# Patient Record
Sex: Female | Born: 1935
Health system: Southern US, Community
[De-identification: ages and names within clinical notes are randomized; demographics above are authoritative.]

## PROBLEM LIST (undated history)

## (undated) DIAGNOSIS — I10 Essential (primary) hypertension: Secondary | ICD-10-CM

---

## 2011-08-22 DIAGNOSIS — Z6828 Body mass index (BMI) 28.0-28.9, adult: Secondary | ICD-10-CM | POA: Diagnosis not present

## 2011-08-22 DIAGNOSIS — E782 Mixed hyperlipidemia: Secondary | ICD-10-CM | POA: Diagnosis not present

## 2011-08-22 DIAGNOSIS — H902 Conductive hearing loss, unspecified: Secondary | ICD-10-CM | POA: Diagnosis not present

## 2011-08-22 DIAGNOSIS — Z1389 Encounter for screening for other disorder: Secondary | ICD-10-CM | POA: Diagnosis not present

## 2011-08-22 DIAGNOSIS — I10 Essential (primary) hypertension: Secondary | ICD-10-CM | POA: Diagnosis not present

## 2011-12-18 DIAGNOSIS — E782 Mixed hyperlipidemia: Secondary | ICD-10-CM | POA: Diagnosis not present

## 2011-12-18 DIAGNOSIS — H6120 Impacted cerumen, unspecified ear: Secondary | ICD-10-CM | POA: Diagnosis not present

## 2011-12-18 DIAGNOSIS — I10 Essential (primary) hypertension: Secondary | ICD-10-CM | POA: Diagnosis not present

## 2011-12-20 DIAGNOSIS — H6120 Impacted cerumen, unspecified ear: Secondary | ICD-10-CM | POA: Diagnosis not present

## 2012-02-28 DIAGNOSIS — Z23 Encounter for immunization: Secondary | ICD-10-CM | POA: Diagnosis not present

## 2012-04-22 DIAGNOSIS — I10 Essential (primary) hypertension: Secondary | ICD-10-CM | POA: Diagnosis not present

## 2012-04-22 DIAGNOSIS — E782 Mixed hyperlipidemia: Secondary | ICD-10-CM | POA: Diagnosis not present

## 2012-04-28 DIAGNOSIS — H3530 Unspecified macular degeneration: Secondary | ICD-10-CM | POA: Diagnosis not present

## 2012-10-20 DIAGNOSIS — I10 Essential (primary) hypertension: Secondary | ICD-10-CM | POA: Diagnosis not present

## 2012-10-20 DIAGNOSIS — Z1331 Encounter for screening for depression: Secondary | ICD-10-CM | POA: Diagnosis not present

## 2012-10-20 DIAGNOSIS — E782 Mixed hyperlipidemia: Secondary | ICD-10-CM | POA: Diagnosis not present

## 2013-02-12 DIAGNOSIS — Z23 Encounter for immunization: Secondary | ICD-10-CM | POA: Diagnosis not present

## 2013-04-22 DIAGNOSIS — M109 Gout, unspecified: Secondary | ICD-10-CM | POA: Diagnosis not present

## 2013-04-22 DIAGNOSIS — E782 Mixed hyperlipidemia: Secondary | ICD-10-CM | POA: Diagnosis not present

## 2013-04-22 DIAGNOSIS — Z6826 Body mass index (BMI) 26.0-26.9, adult: Secondary | ICD-10-CM | POA: Diagnosis not present

## 2013-04-22 DIAGNOSIS — I10 Essential (primary) hypertension: Secondary | ICD-10-CM | POA: Diagnosis not present

## 2013-10-27 DIAGNOSIS — I10 Essential (primary) hypertension: Secondary | ICD-10-CM | POA: Diagnosis not present

## 2013-10-27 DIAGNOSIS — Z139 Encounter for screening, unspecified: Secondary | ICD-10-CM | POA: Diagnosis not present

## 2013-10-27 DIAGNOSIS — H6120 Impacted cerumen, unspecified ear: Secondary | ICD-10-CM | POA: Diagnosis not present

## 2013-10-27 DIAGNOSIS — E782 Mixed hyperlipidemia: Secondary | ICD-10-CM | POA: Diagnosis not present

## 2013-10-27 DIAGNOSIS — Z1331 Encounter for screening for depression: Secondary | ICD-10-CM | POA: Diagnosis not present

## 2013-10-30 DIAGNOSIS — Z1389 Encounter for screening for other disorder: Secondary | ICD-10-CM | POA: Diagnosis not present

## 2013-10-30 DIAGNOSIS — Z1331 Encounter for screening for depression: Secondary | ICD-10-CM | POA: Diagnosis not present

## 2013-10-30 DIAGNOSIS — Z139 Encounter for screening, unspecified: Secondary | ICD-10-CM | POA: Diagnosis not present

## 2013-10-30 DIAGNOSIS — H6120 Impacted cerumen, unspecified ear: Secondary | ICD-10-CM | POA: Diagnosis not present

## 2013-10-30 DIAGNOSIS — Z0000 Encounter for general adult medical examination without abnormal findings: Secondary | ICD-10-CM | POA: Diagnosis not present

## 2014-02-23 DIAGNOSIS — Z23 Encounter for immunization: Secondary | ICD-10-CM | POA: Diagnosis not present

## 2014-05-03 DIAGNOSIS — I129 Hypertensive chronic kidney disease with stage 1 through stage 4 chronic kidney disease, or unspecified chronic kidney disease: Secondary | ICD-10-CM | POA: Diagnosis not present

## 2014-05-03 DIAGNOSIS — N183 Chronic kidney disease, stage 3 (moderate): Secondary | ICD-10-CM | POA: Diagnosis not present

## 2014-05-03 DIAGNOSIS — H6120 Impacted cerumen, unspecified ear: Secondary | ICD-10-CM | POA: Diagnosis not present

## 2014-05-03 DIAGNOSIS — E784 Other hyperlipidemia: Secondary | ICD-10-CM | POA: Diagnosis not present

## 2014-05-05 DIAGNOSIS — H6120 Impacted cerumen, unspecified ear: Secondary | ICD-10-CM | POA: Diagnosis not present

## 2014-11-02 DIAGNOSIS — I129 Hypertensive chronic kidney disease with stage 1 through stage 4 chronic kidney disease, or unspecified chronic kidney disease: Secondary | ICD-10-CM | POA: Diagnosis not present

## 2014-11-02 DIAGNOSIS — N183 Chronic kidney disease, stage 3 (moderate): Secondary | ICD-10-CM | POA: Diagnosis not present

## 2014-11-02 DIAGNOSIS — Z1389 Encounter for screening for other disorder: Secondary | ICD-10-CM | POA: Diagnosis not present

## 2014-11-02 DIAGNOSIS — Z0000 Encounter for general adult medical examination without abnormal findings: Secondary | ICD-10-CM | POA: Diagnosis not present

## 2014-11-02 DIAGNOSIS — E784 Other hyperlipidemia: Secondary | ICD-10-CM | POA: Diagnosis not present

## 2014-11-02 DIAGNOSIS — K5909 Other constipation: Secondary | ICD-10-CM | POA: Diagnosis not present

## 2014-11-02 DIAGNOSIS — Z139 Encounter for screening, unspecified: Secondary | ICD-10-CM | POA: Diagnosis not present

## 2014-11-04 DIAGNOSIS — H6120 Impacted cerumen, unspecified ear: Secondary | ICD-10-CM | POA: Diagnosis not present

## 2015-03-02 DIAGNOSIS — Z23 Encounter for immunization: Secondary | ICD-10-CM | POA: Diagnosis not present

## 2015-03-28 DIAGNOSIS — S0181XA Laceration without foreign body of other part of head, initial encounter: Secondary | ICD-10-CM | POA: Diagnosis not present

## 2015-03-28 DIAGNOSIS — S098XXA Other specified injuries of head, initial encounter: Secondary | ICD-10-CM | POA: Diagnosis not present

## 2015-03-29 ENCOUNTER — Encounter (HOSPITAL_COMMUNITY): Payer: Self-pay | Admitting: Emergency Medicine

## 2015-03-29 ENCOUNTER — Emergency Department (HOSPITAL_COMMUNITY)
Admission: EM | Admit: 2015-03-29 | Discharge: 2015-03-29 | Disposition: A | Discharge status: Home / Self Care | Payer: Medicare Other | Attending: Emergency Medicine | Admitting: Emergency Medicine

## 2015-03-29 ENCOUNTER — Emergency Department (HOSPITAL_COMMUNITY): Payer: Medicare Other

## 2015-03-29 DIAGNOSIS — R55 Syncope and collapse: Secondary | ICD-10-CM | POA: Diagnosis not present

## 2015-03-29 DIAGNOSIS — Y929 Unspecified place or not applicable: Secondary | ICD-10-CM | POA: Diagnosis not present

## 2015-03-29 DIAGNOSIS — Y999 Unspecified external cause status: Secondary | ICD-10-CM | POA: Insufficient documentation

## 2015-03-29 DIAGNOSIS — Z23 Encounter for immunization: Secondary | ICD-10-CM | POA: Insufficient documentation

## 2015-03-29 DIAGNOSIS — W06XXXA Fall from bed, initial encounter: Secondary | ICD-10-CM | POA: Diagnosis not present

## 2015-03-29 DIAGNOSIS — S0181XA Laceration without foreign body of other part of head, initial encounter: Secondary | ICD-10-CM | POA: Insufficient documentation

## 2015-03-29 DIAGNOSIS — S199XXA Unspecified injury of neck, initial encounter: Secondary | ICD-10-CM | POA: Diagnosis not present

## 2015-03-29 DIAGNOSIS — W19XXXA Unspecified fall, initial encounter: Secondary | ICD-10-CM

## 2015-03-29 DIAGNOSIS — Y939 Activity, unspecified: Secondary | ICD-10-CM | POA: Diagnosis not present

## 2015-03-29 DIAGNOSIS — I10 Essential (primary) hypertension: Secondary | ICD-10-CM | POA: Insufficient documentation

## 2015-03-29 DIAGNOSIS — S0990XA Unspecified injury of head, initial encounter: Secondary | ICD-10-CM | POA: Diagnosis present

## 2015-03-29 DIAGNOSIS — S0191XA Laceration without foreign body of unspecified part of head, initial encounter: Secondary | ICD-10-CM | POA: Diagnosis not present

## 2015-03-29 HISTORY — DX: Essential (primary) hypertension: I10

## 2015-03-29 LAB — CBC WITH DIFFERENTIAL/PLATELET
Basophils Absolute: 0 10*3/uL (ref 0.0–0.1)
Basophils Relative: 0 %
Eosinophils Absolute: 0.1 10*3/uL (ref 0.0–0.7)
Eosinophils Relative: 1 %
HCT: 35 % — ABNORMAL LOW (ref 36.0–46.0)
Hemoglobin: 11.2 g/dL — ABNORMAL LOW (ref 12.0–15.0)
Lymphocytes Relative: 17 %
Lymphs Abs: 1.5 10*3/uL (ref 0.7–4.0)
MCH: 31.1 pg (ref 26.0–34.0)
MCHC: 32 g/dL (ref 30.0–36.0)
MCV: 97.2 fL (ref 78.0–100.0)
Monocytes Absolute: 1 10*3/uL (ref 0.1–1.0)
Monocytes Relative: 12 %
Neutro Abs: 6 10*3/uL (ref 1.7–7.7)
Neutrophils Relative %: 70 %
Platelets: 140 10*3/uL — ABNORMAL LOW (ref 150–400)
RBC: 3.6 MIL/uL — ABNORMAL LOW (ref 3.87–5.11)
RDW: 15.5 % (ref 11.5–15.5)
WBC: 8.6 10*3/uL (ref 4.0–10.5)

## 2015-03-29 LAB — I-STAT CHEM 8, ED
BUN: 25 mg/dL — ABNORMAL HIGH (ref 6–20)
Calcium, Ion: 1.2 mmol/L (ref 1.13–1.30)
Chloride: 101 mmol/L (ref 101–111)
Creatinine, Ser: 1.2 mg/dL — ABNORMAL HIGH (ref 0.44–1.00)
Glucose, Bld: 118 mg/dL — ABNORMAL HIGH (ref 65–99)
HCT: 34 % — ABNORMAL LOW (ref 36.0–46.0)
Hemoglobin: 11.6 g/dL — ABNORMAL LOW (ref 12.0–15.0)
Potassium: 3.9 mmol/L (ref 3.5–5.1)
Sodium: 137 mmol/L (ref 135–145)
TCO2: 28 mmol/L (ref 0–100)

## 2015-03-29 MED ORDER — CEFAZOLIN SODIUM 1-5 GM-% IV SOLN
1.0000 g | Freq: Once | INTRAVENOUS | Status: AC
  Administered 2015-03-29: 1 g via INTRAVENOUS
  Filled 2015-03-29: qty 50

## 2015-03-29 MED ORDER — CEPHALEXIN 500 MG PO CAPS: 500.0000 mg | ORAL_CAPSULE | Freq: Three times a day (TID) | ORAL | Status: AC

## 2015-03-29 MED ORDER — TETANUS-DIPHTH-ACELL PERTUSSIS 5-2.5-18.5 LF-MCG/0.5 IM SUSP
0.5000 mL | Freq: Once | INTRAMUSCULAR | Status: AC
  Administered 2015-03-29: 0.5 mL via INTRAMUSCULAR
  Filled 2015-03-29: qty 0.5

## 2015-03-29 MED ORDER — LIDOCAINE-EPINEPHRINE (PF) 2 %-1:200000 IJ SOLN
20.0000 mL | Freq: Once | INTRAMUSCULAR | Status: AC
  Administered 2015-03-29: 20 mL
  Filled 2015-03-29: qty 20

## 2015-03-29 NOTE — ED Provider Notes (Signed)
By signing my name below, I, Bethel BornBritney McCollum, attest that this documentation has been prepared under the direction and in the presence of Nakira Litzau, MD. Electronically Signed: Bethel BornBritney McCollum, ED Scribe. 03/29/2015. 2:25 AM.  Medical screening examination/treatment/procedure(s) were conducted as a shared visit with non-physician practitioner(s) and myself.  I personally evaluated the patient during the encounter.  Medical screening examination/treatment/procedure(s) were conducted as a shared visit with non-physician practitioner(s) and myself.  I personally evaluated the patient during the encounter.   EKG Interpretation None       Physical Exam  Constitutional: She is oriented to person, place, and time. She appears well-developed and well-nourished.  HENT:  Head: Normocephalic.  5 inch J shaped laceration with a flap extending onto right medial canthus and eyelid Globe appears intact Moist mucous membranes No exudate  Neck: Normal range of motion.  Trachea midline  Cardiovascular: Normal rate and regular rhythm.   Pulmonary/Chest: Effort normal and breath sounds normal.  CTAB  Abdominal: Soft. Bowel sounds are normal. She exhibits no mass. There is no tenderness. There is no rebound and no guarding.  Musculoskeletal: Normal range of motion.  Neurological: She is alert and oriented to person, place, and time. She has normal reflexes.  5/5 strength X4  Skin: Skin is warm and dry.  Psychiatric: She has a normal mood and affect. Her behavior is normal.  Nursing note and vitals reviewed.  2:24 AM ENT is now at the bedside to repair the laceration.   I personally performed the services described in this documentation, which was scribed in my presence. The recorded information has been reviewed and is accurate.      Cy BlamerApril Timathy Newberry, MD 03/29/15 [phone]

## 2015-03-29 NOTE — ED Provider Notes (Signed)
CSN: [id number]     Arrival date & time 03/29/15  0001 History   First MD Initiated Contact with Patient 03/29/15 0002     Chief Complaint  Patient presents with  . Fall     (Consider location/radiation/quality/duration/timing/severity/associated sxs/prior Treatment) HPI   Holly Moreno is a 81 y.o. female, pt with no pertinent past medical history, presents with facial and head trauma follow a fall from her bed this evening. Pt does not know if she passed out. Pt states, "Maybe I was trying to get out of bed and fell and hit my head on the night stand."  Pt denies N/V, headache, vision changes, neck/back pain, numbness, tingling or weakness. Pt is not taking any blood thinners. Pt only complaint is about the blood on her face. Pt denies recent illness, fever/chills, urinary symptoms, or any other recent complaints.     Past Medical History  Diagnosis Date  . Hypertension    History reviewed. No pertinent past surgical history. History reviewed. No pertinent family history. Social History  Substance Use Topics  . Smoking status: Never Smoker   . Smokeless tobacco: None  . Alcohol Use: No   OB History    No data available     Review of Systems  HENT:       Laceration on forehead accompanied by bruising.   Respiratory: Negative for chest tightness and shortness of breath.   Cardiovascular: Negative for chest pain, palpitations and leg swelling.  Gastrointestinal: Negative for nausea, vomiting, abdominal pain and diarrhea.  Genitourinary: Negative for dysuria, urgency, frequency and flank pain.  Musculoskeletal: Negative for back pain, arthralgias, neck pain and neck stiffness.  Skin: Positive for wound. Negative for color change and pallor.  Neurological: Positive for syncope. Negative for dizziness, seizures, weakness, light-headedness, numbness and headaches.  All other systems reviewed and are negative.     Allergies  Sulfa antibiotics  Home Medications   Prior  to Admission medications   Not on File   BP 156/81 mmHg  Pulse 72  Temp(Src) 97.6 F (36.4 C) (Oral)  Resp 14  SpO2 98% Physical Exam  Constitutional: She is oriented to person, place, and time. She appears well-developed and well-nourished. No distress.  HENT:  Head: Normocephalic and atraumatic.  Significant periorbital swelling and contusions.   Eyes: Conjunctivae are normal. Pupils are equal, round, and reactive to light.  No trauma noted to the left eye or orbit. Globe appears intact in the right eye.  Neck: Normal range of motion.  C-spine non-tender with no step off.   Cardiovascular: Normal rate, regular rhythm and normal heart sounds.   Pulmonary/Chest: Effort normal and breath sounds normal. No respiratory distress.  Abdominal: Soft. Bowel sounds are normal. There is no tenderness.  Musculoskeletal: Normal range of motion. She exhibits no edema or tenderness.  Full trauma exam reveals no pain, deformity, or instability. ROM intact all four extremities. T-spine and L-spine with no tenderness or step off. Pelvic stable without pain or tenderness palpation.   Neurological: She is alert and oriented to person, place, and time. She has normal reflexes.  Cranial nerves II-XII grossly intact. Strength 5/5. No numbness, tingling, or other sensory deficits. Gait testing deferred until imaging returns.   Skin: Skin is warm and dry. She is not diaphoretic.  Approximately 5 inch, J-shaped laceration, with flap, extending from the hairline to across the medial canthus and into the eye lid. Medial canthus is intact.   Psychiatric: She has a normal mood and affect.  Her behavior is normal.  Nursing note and vitals reviewed.   ED Course  Procedures (including critical care time) Labs Review Labs Reviewed  CBC WITH DIFFERENTIAL/PLATELET  I-STAT CHEM 8, ED    Imaging Review No results found. I have personally reviewed and evaluated these images and lab results as part of my medical  decision-making.   EKG Interpretation None      MDM   Final diagnoses:  Fall, initial encounter  Facial laceration, initial encounter    Holly Moreno presents with facial laceration and contusions following a fall with questionable LOC.   Findings and plan of care discussed with April Palumbo, MD.  CT of Head, Face, Orbits, and C-spine ordered. No indication for further imaging at this time. Due to laceration crossing the medial canthus, a consult to Maxillofacial surgery is warranted. Consult called by April Palumbo, MD. Plan to address any abnormalities found on CT scans and defer to Maxillofacial surgeon for closure of wound. Dr. Pollyann Kennedy is surgeon on call for this service.  End of shift patient care transferred to [name], FNP. Imaging results not returned by end of shift.   Anselm Pancoast, PA-C 03/29/15 0981  April Palumbo, MD 03/29/15 [phone]

## 2015-03-29 NOTE — Consult Note (Signed)
Reason for Consult: Facial laceration Referring Physician: April Palumbo, MD  Holly Moreno is an 81 y.o. female.  HPI: Otherwise healthy lady, woke up next to her bed with facial injuries.  Past Medical History  Diagnosis Date  . Hypertension     History reviewed. No pertinent past surgical history.  History reviewed. No pertinent family history.  Social History:  reports that she has never smoked. She does not have any smokeless tobacco history on file. She reports that she does not drink alcohol. Her drug history is not on file.  Allergies:  Allergies  Allergen Reactions  . Sulfa Antibiotics Other (See Comments)    Family unsure of reaction     Medications: Reviewed  Results for orders placed or performed during the hospital encounter of 03/29/15 (from the past 48 hour(s))  CBC with Differential     Status: Abnormal   Collection Time: 03/29/15 12:55 AM  Result Value Ref Range   WBC 8.6 4.0 - 10.5 K/uL   RBC 3.60 (L) 3.87 - 5.11 MIL/uL   Hemoglobin 11.2 (L) 12.0 - 15.0 g/dL   HCT 13.0 (L) 86.5 - 78.4 %   MCV 97.2 78.0 - 100.0 fL   MCH 31.1 26.0 - 34.0 pg   MCHC 32.0 30.0 - 36.0 g/dL   RDW 69.6 29.5 - 28.4 %   Platelets 140 (L) 150 - 400 K/uL   Neutrophils Relative % 70 %   Neutro Abs 6.0 1.7 - 7.7 K/uL   Lymphocytes Relative 17 %   Lymphs Abs 1.5 0.7 - 4.0 K/uL   Monocytes Relative 12 %   Monocytes Absolute 1.0 0.1 - 1.0 K/uL   Eosinophils Relative 1 %   Eosinophils Absolute 0.1 0.0 - 0.7 K/uL   Basophils Relative 0 %   Basophils Absolute 0.0 0.0 - 0.1 K/uL  I-Stat Chem 8, ED     Status: Abnormal   Collection Time: 03/29/15  1:01 AM  Result Value Ref Range   Sodium 137 135 - 145 mmol/L   Potassium 3.9 3.5 - 5.1 mmol/L   Chloride 101 101 - 111 mmol/L   BUN 25 (H) 6 - 20 mg/dL   Creatinine, Ser 1.32 (H) 0.44 - 1.00 mg/dL   Glucose, Bld 440 (H) 65 - 99 mg/dL   Calcium, Ion 1.02 7.25 - 1.30 mmol/L   TCO2 28 0 - 100 mmol/L   Hemoglobin 11.6 (L) 12.0 - 15.0  g/dL   HCT 36.6 (L) 44.0 - 34.7 %    Ct Head Wo Contrast  03/29/2015  CLINICAL DATA:  Status post fall out of bed, hitting right orbit and face on night stand. Laceration extending to the right orbit. Concern for cervical spine injury. Initial encounter. EXAM: CT HEAD WITHOUT CONTRAST CT MAXILLOFACIAL WITHOUT CONTRAST CT CERVICAL SPINE WITHOUT CONTRAST TECHNIQUE: Multidetector CT imaging of the head, cervical spine, and maxillofacial structures were performed using the standard protocol without intravenous contrast. Multiplanar CT image reconstructions of the cervical spine and maxillofacial structures were also generated. COMPARISON:  None. FINDINGS: CT HEAD FINDINGS There is no evidence of acute infarction, mass lesion, or intra- or extra-axial hemorrhage on CT. The posterior fossa, including the cerebellum, brainstem and fourth ventricle, is within normal limits. The third and lateral ventricles, and basal ganglia are unremarkable in appearance. The cerebral hemispheres are symmetric in appearance, with normal gray-white differentiation. No mass effect or midline shift is seen. There is no evidence of fracture; visualized osseous structures are unremarkable in appearance. The visualized portions  of the orbits are within normal limits. The paranasal sinuses and mastoid air cells are well-aerated. Diffuse soft tissue swelling is noted surrounding the right orbit, with scattered soft tissue air and associated laceration. CT MAXILLOFACIAL FINDINGS There is no evidence of fracture or dislocation. The maxilla and mandible appear intact. The nasal bone is unremarkable in appearance. There is chronic absence of the maxillary teeth, and multiple dental caries are noted at the remaining mandibular teeth. The orbits are intact bilaterally. The visualized paranasal sinuses and mastoid air cells are well-aerated. Diffuse soft tissue swelling is noted surrounding the right orbit, with scattered soft tissue air and  associated laceration. The parapharyngeal fat planes are preserved. The nasopharynx, oropharynx and hypopharynx are unremarkable in appearance. The visualized portions of the valleculae and piriform sinuses are grossly unremarkable. The parotid and submandibular glands are within normal limits. No cervical lymphadenopathy is seen. CT CERVICAL SPINE FINDINGS There is no evidence of acute fracture or subluxation. There is mild grade 1 retrolisthesis of C4 on C5, and multilevel disc space narrowing is noted along the cervical spine, with scattered anterior and posterior disc osteophyte complexes. Vertebral bodies demonstrate normal height. Prevertebral soft tissues are within normal limits. A 2.6 cm nodule is noted inferior to the left thyroid lobe. The visualized lung apices are clear. Mild calcification is noted at the left carotid bifurcation. IMPRESSION: 1. No evidence of traumatic intracranial injury or fracture. 2. No evidence of fracture or dislocation with regard to the maxillofacial structures. 3. No evidence of acute fracture or subluxation along the cervical spine. 4. Diffuse soft tissue swelling surrounding the right orbit, with scattered soft tissue air and associated laceration. The right orbit appears intact. 5. Chronic absence of the maxillary teeth, and multiple dental caries at the remaining mandibular teeth. 6. Mild degenerative change noted along the cervical spine. 7. 2.6 cm nodule noted inferior to the left thyroid lobe. Consider further evaluation with thyroid ultrasound. If patient is clinically hyperthyroid, consider nuclear medicine thyroid uptake and scan. 8. Mild calcification at the left carotid bifurcation. Electronically Signed   By: Roanna RaiderJeffery  Chang M.D.   On: 03/29/2015 01:16   Ct Cervical Spine Wo Contrast  03/29/2015  CLINICAL DATA:  Status post fall out of bed, hitting right orbit and face on night stand. Laceration extending to the right orbit. Concern for cervical spine injury.  Initial encounter. EXAM: CT HEAD WITHOUT CONTRAST CT MAXILLOFACIAL WITHOUT CONTRAST CT CERVICAL SPINE WITHOUT CONTRAST TECHNIQUE: Multidetector CT imaging of the head, cervical spine, and maxillofacial structures were performed using the standard protocol without intravenous contrast. Multiplanar CT image reconstructions of the cervical spine and maxillofacial structures were also generated. COMPARISON:  None. FINDINGS: CT HEAD FINDINGS There is no evidence of acute infarction, mass lesion, or intra- or extra-axial hemorrhage on CT. The posterior fossa, including the cerebellum, brainstem and fourth ventricle, is within normal limits. The third and lateral ventricles, and basal ganglia are unremarkable in appearance. The cerebral hemispheres are symmetric in appearance, with normal gray-white differentiation. No mass effect or midline shift is seen. There is no evidence of fracture; visualized osseous structures are unremarkable in appearance. The visualized portions of the orbits are within normal limits. The paranasal sinuses and mastoid air cells are well-aerated. Diffuse soft tissue swelling is noted surrounding the right orbit, with scattered soft tissue air and associated laceration. CT MAXILLOFACIAL FINDINGS There is no evidence of fracture or dislocation. The maxilla and mandible appear intact. The nasal bone is unremarkable in appearance. There is  chronic absence of the maxillary teeth, and multiple dental caries are noted at the remaining mandibular teeth. The orbits are intact bilaterally. The visualized paranasal sinuses and mastoid air cells are well-aerated. Diffuse soft tissue swelling is noted surrounding the right orbit, with scattered soft tissue air and associated laceration. The parapharyngeal fat planes are preserved. The nasopharynx, oropharynx and hypopharynx are unremarkable in appearance. The visualized portions of the valleculae and piriform sinuses are grossly unremarkable. The parotid and  submandibular glands are within normal limits. No cervical lymphadenopathy is seen. CT CERVICAL SPINE FINDINGS There is no evidence of acute fracture or subluxation. There is mild grade 1 retrolisthesis of C4 on C5, and multilevel disc space narrowing is noted along the cervical spine, with scattered anterior and posterior disc osteophyte complexes. Vertebral bodies demonstrate normal height. Prevertebral soft tissues are within normal limits. A 2.6 cm nodule is noted inferior to the left thyroid lobe. The visualized lung apices are clear. Mild calcification is noted at the left carotid bifurcation. IMPRESSION: 1. No evidence of traumatic intracranial injury or fracture. 2. No evidence of fracture or dislocation with regard to the maxillofacial structures. 3. No evidence of acute fracture or subluxation along the cervical spine. 4. Diffuse soft tissue swelling surrounding the right orbit, with scattered soft tissue air and associated laceration. The right orbit appears intact. 5. Chronic absence of the maxillary teeth, and multiple dental caries at the remaining mandibular teeth. 6. Mild degenerative change noted along the cervical spine. 7. 2.6 cm nodule noted inferior to the left thyroid lobe. Consider further evaluation with thyroid ultrasound. If patient is clinically hyperthyroid, consider nuclear medicine thyroid uptake and scan. 8. Mild calcification at the left carotid bifurcation. Electronically Signed   By: Roanna Raider M.D.   On: 03/29/2015 01:16   Ct Maxillofacial Wo Cm  03/29/2015  CLINICAL DATA:  Status post fall out of bed, hitting right orbit and face on night stand. Laceration extending to the right orbit. Concern for cervical spine injury. Initial encounter. EXAM: CT HEAD WITHOUT CONTRAST CT MAXILLOFACIAL WITHOUT CONTRAST CT CERVICAL SPINE WITHOUT CONTRAST TECHNIQUE: Multidetector CT imaging of the head, cervical spine, and maxillofacial structures were performed using the standard protocol  without intravenous contrast. Multiplanar CT image reconstructions of the cervical spine and maxillofacial structures were also generated. COMPARISON:  None. FINDINGS: CT HEAD FINDINGS There is no evidence of acute infarction, mass lesion, or intra- or extra-axial hemorrhage on CT. The posterior fossa, including the cerebellum, brainstem and fourth ventricle, is within normal limits. The third and lateral ventricles, and basal ganglia are unremarkable in appearance. The cerebral hemispheres are symmetric in appearance, with normal gray-white differentiation. No mass effect or midline shift is seen. There is no evidence of fracture; visualized osseous structures are unremarkable in appearance. The visualized portions of the orbits are within normal limits. The paranasal sinuses and mastoid air cells are well-aerated. Diffuse soft tissue swelling is noted surrounding the right orbit, with scattered soft tissue air and associated laceration. CT MAXILLOFACIAL FINDINGS There is no evidence of fracture or dislocation. The maxilla and mandible appear intact. The nasal bone is unremarkable in appearance. There is chronic absence of the maxillary teeth, and multiple dental caries are noted at the remaining mandibular teeth. The orbits are intact bilaterally. The visualized paranasal sinuses and mastoid air cells are well-aerated. Diffuse soft tissue swelling is noted surrounding the right orbit, with scattered soft tissue air and associated laceration. The parapharyngeal fat planes are preserved. The nasopharynx, oropharynx and hypopharynx  are unremarkable in appearance. The visualized portions of the valleculae and piriform sinuses are grossly unremarkable. The parotid and submandibular glands are within normal limits. No cervical lymphadenopathy is seen. CT CERVICAL SPINE FINDINGS There is no evidence of acute fracture or subluxation. There is mild grade 1 retrolisthesis of C4 on C5, and multilevel disc space narrowing is  noted along the cervical spine, with scattered anterior and posterior disc osteophyte complexes. Vertebral bodies demonstrate normal height. Prevertebral soft tissues are within normal limits. A 2.6 cm nodule is noted inferior to the left thyroid lobe. The visualized lung apices are clear. Mild calcification is noted at the left carotid bifurcation. IMPRESSION: 1. No evidence of traumatic intracranial injury or fracture. 2. No evidence of fracture or dislocation with regard to the maxillofacial structures. 3. No evidence of acute fracture or subluxation along the cervical spine. 4. Diffuse soft tissue swelling surrounding the right orbit, with scattered soft tissue air and associated laceration. The right orbit appears intact. 5. Chronic absence of the maxillary teeth, and multiple dental caries at the remaining mandibular teeth. 6. Mild degenerative change noted along the cervical spine. 7. 2.6 cm nodule noted inferior to the left thyroid lobe. Consider further evaluation with thyroid ultrasound. If patient is clinically hyperthyroid, consider nuclear medicine thyroid uptake and scan. 8. Mild calcification at the left carotid bifurcation. Electronically Signed   By: Roanna Raider M.D.   On: 03/29/2015 01:16    [id number] except as listed in admit H&P  Blood pressure 123/57, pulse 78, temperature 97.6 F (36.4 C), temperature source Oral, resp. rate 14, SpO2 95 %.  PHYSICAL EXAM: Overall appearance:  Healthy appearing, in no distress Head:  Dried blood and some fresh blood all over the right side of the face and forehead area. Large complex curvilinear laceration involving the frontal scalp area, and down along the eyebrow and the upper lid area. Some of the skin edges are necrotic. Ears: External ears are normal. Nose: External nose is healthy in appearance. Internal nasal exam free of any lesions or obstruction. Oral Cavity:  There are no mucosal lesions or masses identified. Oral  Pharynx/Hypopharynx/Larynx: no signs of any mucosal lesions or masses identified.  Neuro:  No identifiable neurologic deficits. Neck: No palpable neck masses.  Studies Reviewed: Maxillofacial CT  Procedures: Complex closure facial laceration.  Xylocaine with epinephrine was infiltrated into the entire forehead and upper eyelid area. The wounds were scrubbed with Betadine solution. 3-0 Monocryl was used for deep layer closure and to line up the edges. The part of the laceration involving the eyebrow contain some necrotic skin edges that were trimmed off with scissors. Everything else was reapproximated with running 4-0 chromic suture in small segments. There was a triangular flap of skin that was realigned along the forehead laceration with the base inferior. There is no necrosis of the tip in this area. There is no foreign object cleaned out of the wound. Calvarium is intact without any obvious palpable fracture. Bacitracin was applied along the closure sites.   Assessment/Plan: Complex facial lacerations, debridement and closure performed. We will treat with oral antibiotics for 1 week. Follow-up in my office next week.  Holly Moreno 03/29/2015, 3:25 AM

## 2015-03-29 NOTE — ED Notes (Signed)
MD at bedside. 

## 2015-03-29 NOTE — Discharge Instructions (Signed)
Apply antibiotic ointment to all the closure sites twice daily. Keep everything clean and dry. Fall Prevention in Hospitals, Adult As a hospital patient, your condition and the treatments you receive can increase your risk for falls. Some additional risk factors for falls in a hospital include:  Being in an unfamiliar environment.  Being on bed rest.  Your surgery.  Taking certain medicines.  Your tubing requirements, such as intravenous (IV) therapy or catheters. It is important that you learn how to decrease fall risks while at the hospital. Below are important tips that can help prevent falls. SAFETY TIPS FOR PREVENTING FALLS Talk about your risk of falling.  Ask your health care provider why you are at risk for falling. Is it your medicine, illness, tubing placement, or something else?  Make a plan with your health care provider to keep you safe from falls.  Ask your health care provider or pharmacist about side effects of your medicines. Some medicines can make you dizzy or affect your coordination. Ask for help.  Ask for help before getting out of bed. You may need to press your call button.  Ask for assistance in getting safely to the toilet.  Ask for a walker or cane to be put at your bedside. Ask that most of the side rails on your bed be placed up before your health care provider leaves the room.  Ask family or friends to sit with you.  Ask for things that are out of your reach, such as your glasses, hearing aids, telephone, bedside table, or call button. Follow these tips to avoid falling:  Stay lying or seated, rather than standing, while waiting for help.  Wear rubber-soled slippers or shoes whenever you walk in the hospital.  Avoid quick, sudden movements.  Change positions slowly.  Sit on the side of your bed before standing.  Stand up slowly and wait before you start to walk.  Let your health care provider know if there is a spill on the floor.  Pay  careful attention to the medical equipment, electrical cords, and tubes around you.  When you need help, use your call button by your bed or in the bathroom. Wait for one of your health care providers to help you.  If you feel dizzy or unsure of your footing, return to bed and wait for assistance.  Avoid being distracted by the TV, telephone, or another person in your room.  Do not lean or support yourself on rolling objects, such as IV poles or bedside tables.   This information is not intended to replace advice given to you by your health care provider. Make sure you discuss any questions you have with your health care provider.   Document Released: 05/18/2000 Document Revised: 06/11/2014 Document Reviewed: 01/27/2012 [employer] Interactive Patient Education [employer]

## 2015-03-29 NOTE — ED Notes (Signed)
Pt fell as she tried to get out of bed, hitting her head on the nightside table.  According to EMS, family heard the fall but did not know she needed help, questionable LOC as they checked on her about 5 minutes later.  She has a 4inch lac extending from her hair line to her right eye which is swollen shut.  Denies any back or neck pain.

## 2015-04-04 DIAGNOSIS — E782 Mixed hyperlipidemia: Secondary | ICD-10-CM | POA: Diagnosis not present

## 2015-04-04 DIAGNOSIS — I129 Hypertensive chronic kidney disease with stage 1 through stage 4 chronic kidney disease, or unspecified chronic kidney disease: Secondary | ICD-10-CM | POA: Diagnosis not present

## 2015-04-04 DIAGNOSIS — S0191XA Laceration without foreign body of unspecified part of head, initial encounter: Secondary | ICD-10-CM | POA: Diagnosis not present

## 2015-04-04 DIAGNOSIS — N183 Chronic kidney disease, stage 3 (moderate): Secondary | ICD-10-CM | POA: Diagnosis not present

## 2015-04-04 DIAGNOSIS — S0093XA Contusion of unspecified part of head, initial encounter: Secondary | ICD-10-CM | POA: Diagnosis not present

## 2015-04-21 DIAGNOSIS — Z23 Encounter for immunization: Secondary | ICD-10-CM | POA: Diagnosis not present

## 2015-10-04 DIAGNOSIS — N183 Chronic kidney disease, stage 3 (moderate): Secondary | ICD-10-CM | POA: Diagnosis not present

## 2015-10-04 DIAGNOSIS — I129 Hypertensive chronic kidney disease with stage 1 through stage 4 chronic kidney disease, or unspecified chronic kidney disease: Secondary | ICD-10-CM | POA: Diagnosis not present

## 2015-10-04 DIAGNOSIS — E784 Other hyperlipidemia: Secondary | ICD-10-CM | POA: Diagnosis not present

## 2015-10-11 DIAGNOSIS — E782 Mixed hyperlipidemia: Secondary | ICD-10-CM | POA: Diagnosis not present

## 2015-10-11 DIAGNOSIS — N183 Chronic kidney disease, stage 3 (moderate): Secondary | ICD-10-CM | POA: Diagnosis not present

## 2015-10-11 DIAGNOSIS — Z1389 Encounter for screening for other disorder: Secondary | ICD-10-CM | POA: Diagnosis not present

## 2015-10-11 DIAGNOSIS — I129 Hypertensive chronic kidney disease with stage 1 through stage 4 chronic kidney disease, or unspecified chronic kidney disease: Secondary | ICD-10-CM | POA: Diagnosis not present

## 2015-11-08 DIAGNOSIS — Z1389 Encounter for screening for other disorder: Secondary | ICD-10-CM | POA: Diagnosis not present

## 2015-11-08 DIAGNOSIS — Z7189 Other specified counseling: Secondary | ICD-10-CM | POA: Diagnosis not present

## 2015-11-08 DIAGNOSIS — Z0000 Encounter for general adult medical examination without abnormal findings: Secondary | ICD-10-CM | POA: Diagnosis not present

## 2015-11-08 DIAGNOSIS — Z139 Encounter for screening, unspecified: Secondary | ICD-10-CM | POA: Diagnosis not present

## 2016-01-27 ENCOUNTER — Other Ambulatory Visit: Payer: Self-pay

## 2016-02-21 DIAGNOSIS — Z6825 Body mass index (BMI) 25.0-25.9, adult: Secondary | ICD-10-CM | POA: Diagnosis not present

## 2016-02-21 DIAGNOSIS — F513 Sleepwalking [somnambulism]: Secondary | ICD-10-CM | POA: Diagnosis not present

## 2016-03-06 DIAGNOSIS — Z23 Encounter for immunization: Secondary | ICD-10-CM | POA: Diagnosis not present

## 2016-03-13 DIAGNOSIS — Z6825 Body mass index (BMI) 25.0-25.9, adult: Secondary | ICD-10-CM | POA: Diagnosis not present

## 2016-03-13 DIAGNOSIS — F513 Sleepwalking [somnambulism]: Secondary | ICD-10-CM | POA: Diagnosis not present

## 2016-04-17 DIAGNOSIS — N183 Chronic kidney disease, stage 3 (moderate): Secondary | ICD-10-CM | POA: Diagnosis not present

## 2016-04-17 DIAGNOSIS — F513 Sleepwalking [somnambulism]: Secondary | ICD-10-CM | POA: Diagnosis not present

## 2016-04-17 DIAGNOSIS — I129 Hypertensive chronic kidney disease with stage 1 through stage 4 chronic kidney disease, or unspecified chronic kidney disease: Secondary | ICD-10-CM | POA: Diagnosis not present

## 2016-04-17 DIAGNOSIS — E784 Other hyperlipidemia: Secondary | ICD-10-CM | POA: Diagnosis not present

## 2016-05-17 DIAGNOSIS — Z7189 Other specified counseling: Secondary | ICD-10-CM | POA: Diagnosis not present

## 2016-05-17 DIAGNOSIS — Z6825 Body mass index (BMI) 25.0-25.9, adult: Secondary | ICD-10-CM | POA: Diagnosis not present

## 2016-05-17 DIAGNOSIS — H6120 Impacted cerumen, unspecified ear: Secondary | ICD-10-CM | POA: Diagnosis not present

## 2016-08-06 DIAGNOSIS — M109 Gout, unspecified: Secondary | ICD-10-CM | POA: Diagnosis not present

## 2016-08-06 DIAGNOSIS — Z6824 Body mass index (BMI) 24.0-24.9, adult: Secondary | ICD-10-CM | POA: Diagnosis not present

## 2016-08-13 DIAGNOSIS — M79661 Pain in right lower leg: Secondary | ICD-10-CM | POA: Diagnosis not present

## 2016-08-13 DIAGNOSIS — R600 Localized edema: Secondary | ICD-10-CM | POA: Diagnosis not present

## 2016-08-13 DIAGNOSIS — M109 Gout, unspecified: Secondary | ICD-10-CM | POA: Diagnosis not present

## 2016-09-13 DIAGNOSIS — R2241 Localized swelling, mass and lump, right lower limb: Secondary | ICD-10-CM | POA: Diagnosis not present

## 2016-09-13 DIAGNOSIS — M79661 Pain in right lower leg: Secondary | ICD-10-CM | POA: Diagnosis not present

## 2016-09-13 DIAGNOSIS — R600 Localized edema: Secondary | ICD-10-CM | POA: Diagnosis not present

## 2016-10-02 DIAGNOSIS — E784 Other hyperlipidemia: Secondary | ICD-10-CM | POA: Diagnosis not present

## 2016-10-02 DIAGNOSIS — I129 Hypertensive chronic kidney disease with stage 1 through stage 4 chronic kidney disease, or unspecified chronic kidney disease: Secondary | ICD-10-CM | POA: Diagnosis not present

## 2016-10-08 DIAGNOSIS — M109 Gout, unspecified: Secondary | ICD-10-CM | POA: Diagnosis not present

## 2016-10-08 DIAGNOSIS — Z6825 Body mass index (BMI) 25.0-25.9, adult: Secondary | ICD-10-CM | POA: Diagnosis not present

## 2016-10-15 DIAGNOSIS — N183 Chronic kidney disease, stage 3 (moderate): Secondary | ICD-10-CM | POA: Diagnosis not present

## 2016-10-15 DIAGNOSIS — I129 Hypertensive chronic kidney disease with stage 1 through stage 4 chronic kidney disease, or unspecified chronic kidney disease: Secondary | ICD-10-CM | POA: Diagnosis not present

## 2016-10-15 DIAGNOSIS — E782 Mixed hyperlipidemia: Secondary | ICD-10-CM | POA: Diagnosis not present

## 2016-10-15 DIAGNOSIS — M109 Gout, unspecified: Secondary | ICD-10-CM | POA: Diagnosis not present

## 2017-02-08 DIAGNOSIS — E784 Other hyperlipidemia: Secondary | ICD-10-CM | POA: Diagnosis not present

## 2017-02-08 DIAGNOSIS — M109 Gout, unspecified: Secondary | ICD-10-CM | POA: Diagnosis not present

## 2017-02-08 DIAGNOSIS — I129 Hypertensive chronic kidney disease with stage 1 through stage 4 chronic kidney disease, or unspecified chronic kidney disease: Secondary | ICD-10-CM | POA: Diagnosis not present

## 2017-02-22 DIAGNOSIS — N183 Chronic kidney disease, stage 3 (moderate): Secondary | ICD-10-CM | POA: Diagnosis not present

## 2017-02-22 DIAGNOSIS — I10 Essential (primary) hypertension: Secondary | ICD-10-CM | POA: Diagnosis not present

## 2017-02-22 DIAGNOSIS — F3160 Bipolar disorder, current episode mixed, unspecified: Secondary | ICD-10-CM | POA: Diagnosis not present

## 2017-02-22 DIAGNOSIS — M79609 Pain in unspecified limb: Secondary | ICD-10-CM | POA: Diagnosis not present

## 2017-02-22 DIAGNOSIS — E782 Mixed hyperlipidemia: Secondary | ICD-10-CM | POA: Diagnosis not present

## 2017-02-22 DIAGNOSIS — I129 Hypertensive chronic kidney disease with stage 1 through stage 4 chronic kidney disease, or unspecified chronic kidney disease: Secondary | ICD-10-CM | POA: Diagnosis not present

## 2017-02-22 DIAGNOSIS — Z1379 Encounter for other screening for genetic and chromosomal anomalies: Secondary | ICD-10-CM | POA: Diagnosis not present

## 2017-02-22 DIAGNOSIS — Z1389 Encounter for screening for other disorder: Secondary | ICD-10-CM | POA: Diagnosis not present

## 2017-02-22 DIAGNOSIS — Z23 Encounter for immunization: Secondary | ICD-10-CM | POA: Diagnosis not present

## 2017-02-22 DIAGNOSIS — Z139 Encounter for screening, unspecified: Secondary | ICD-10-CM | POA: Diagnosis not present

## 2017-07-20 IMAGING — CT CT CERVICAL SPINE W/O CM
3 of 10 series · 7 of 33 positions shown, 8 images · non-contrast
Comparison: None.

CLINICAL DATA: Status post fall out of bed, hitting right orbit and
face on night stand. Laceration extending to the right orbit.
Concern for cervical spine injury. Initial encounter.

EXAM:
CT HEAD WITHOUT CONTRAST
CT MAXILLOFACIAL WITHOUT CONTRAST
CT CERVICAL SPINE WITHOUT CONTRAST
TECHNIQUE: Multidetector CT imaging of the head, cervical spine, and
maxillofacial structures were performed using the standard protocol
without intravenous contrast. Multiplanar CT image reconstructions
of the cervical spine and maxillofacial structures were also
generated.

[Series 308: coronals soft tissue · coronal · 0.34mm/px · 1 of 60 slices shown]
[im 30/60  bone]
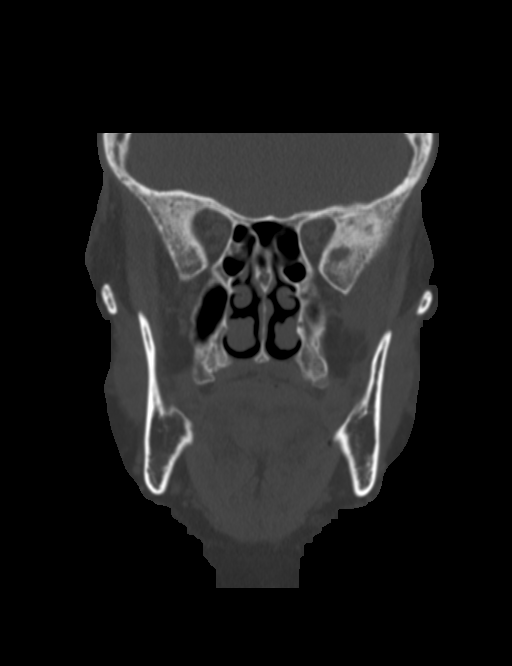

[Series 404: orthogonals · axial · 0.35mm/px · z∈[+25,+146]mm · 3 of 72 slices shown, 4 images]
[im 1/72  soft-tissue]
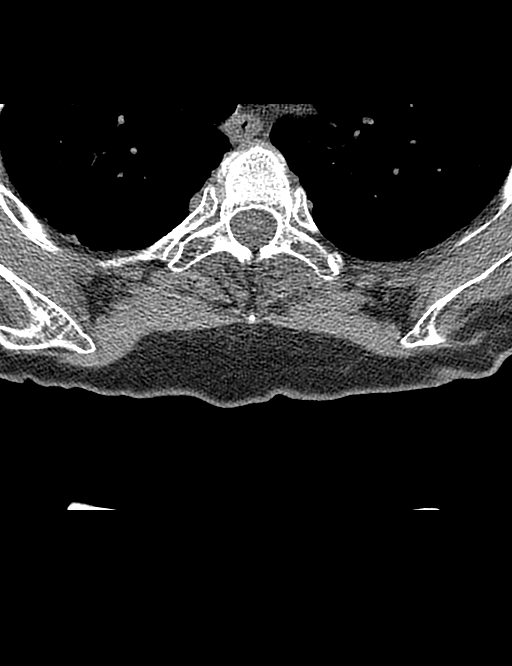
[im 1/72  bone]
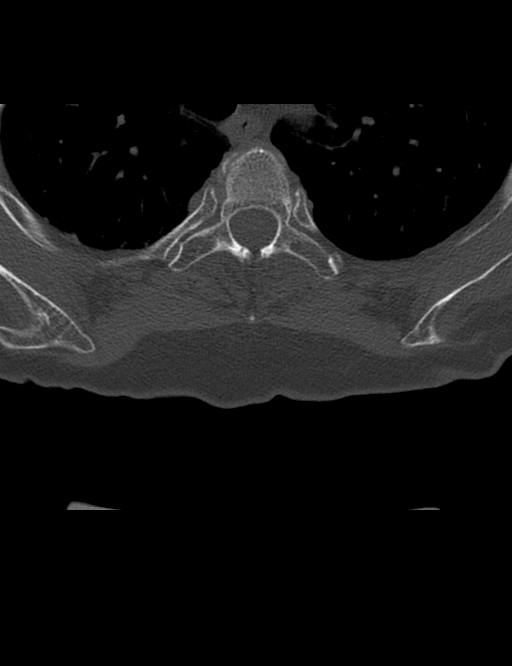
[im 36/72  bone]
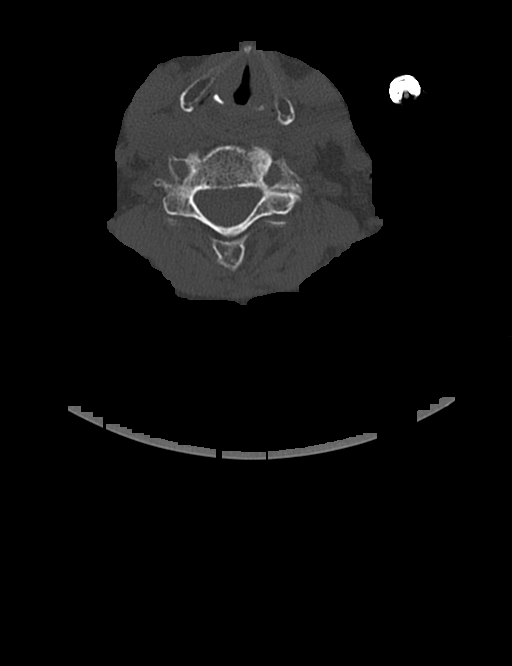
[im 72/72  bone]
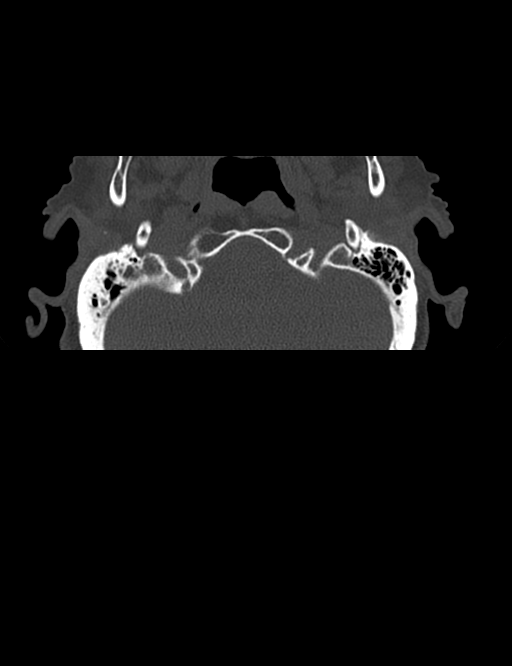

[Series 406: sagittals · sagittal · 0.35mm/px · 3 of 38 slices shown]
[im 10/38  bone]
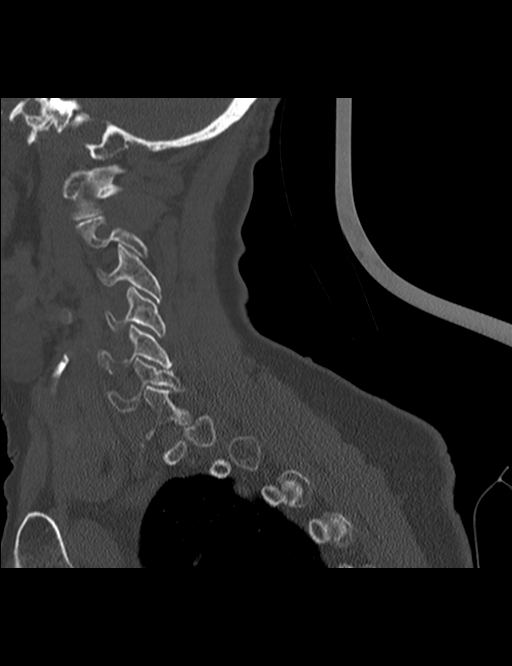
[im 19/38  bone]
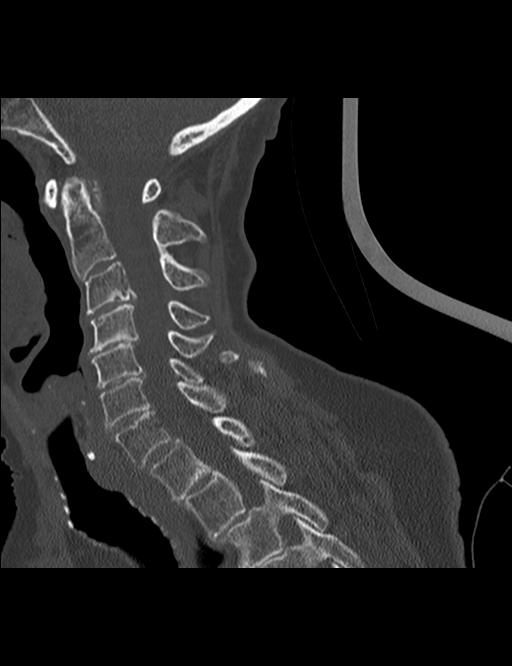
[im 28/38  bone]
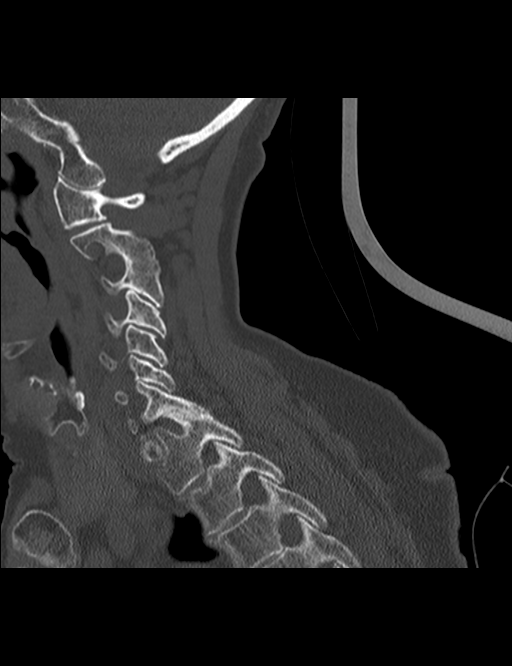

[7 of 33 positions shown; findings below may reference images not displayed]

FINDINGS: CT HEAD FINDINGS

There is no evidence of acute infarction, mass lesion, or intra- or
extra-axial hemorrhage on CT.

The posterior fossa, including the cerebellum, brainstem and fourth
ventricle, is within normal limits. The third and lateral
ventricles, and basal ganglia are unremarkable in appearance. The
cerebral hemispheres are symmetric in appearance, with normal
gray-white differentiation. No mass effect or midline shift is seen.

There is no evidence of fracture; visualized osseous structures are
unremarkable in appearance. The visualized portions of the orbits
are within normal limits. The paranasal sinuses and mastoid air
cells are well-aerated. Diffuse soft tissue swelling is noted
surrounding the right orbit, with scattered soft tissue air and
associated laceration.

CT MAXILLOFACIAL FINDINGS

There is no evidence of fracture or dislocation. The maxilla and
mandible appear intact. The nasal bone is unremarkable in
appearance. There is chronic absence of the maxillary teeth, and
multiple dental caries are noted at the remaining mandibular teeth.

The orbits are intact bilaterally. The visualized paranasal sinuses
and mastoid air cells are well-aerated.

Diffuse soft tissue swelling is noted surrounding the right orbit,
with scattered soft tissue air and associated laceration. The
parapharyngeal fat planes are preserved. The nasopharynx, oropharynx
and hypopharynx are unremarkable in appearance. The visualized
portions of the valleculae and piriform sinuses are grossly
unremarkable.

The parotid and submandibular glands are within normal limits. No
cervical lymphadenopathy is seen.

CT CERVICAL SPINE FINDINGS

There is no evidence of acute fracture or subluxation. There is mild
grade 1 retrolisthesis of C4 on C5, and multilevel disc space
narrowing is noted along the cervical spine, with scattered anterior
and posterior disc osteophyte complexes. Vertebral bodies
demonstrate normal height. Prevertebral soft tissues are within
normal limits.

A 2.6 cm nodule is noted inferior to the left thyroid lobe. The
visualized lung apices are clear. Mild calcification is noted at the
left carotid bifurcation.
IMPRESSION: 1. No evidence of traumatic intracranial injury or fracture.
2. No evidence of fracture or dislocation with regard to the
maxillofacial structures.
3. No evidence of acute fracture or subluxation along the cervical
spine.
4. Diffuse soft tissue swelling surrounding the right orbit, with
scattered soft tissue air and associated laceration. The right orbit
appears intact.
5. Chronic absence of the maxillary teeth, and multiple dental
caries at the remaining mandibular teeth.
6. Mild degenerative change noted along the cervical spine.
7. 2.6 cm nodule noted inferior to the left thyroid lobe. Consider
further evaluation with thyroid ultrasound. If patient is clinically
hyperthyroid, consider nuclear medicine thyroid uptake and scan.
8. Mild calcification at the left carotid bifurcation.

## 2017-08-14 DIAGNOSIS — M109 Gout, unspecified: Secondary | ICD-10-CM | POA: Diagnosis not present

## 2017-08-14 DIAGNOSIS — E782 Mixed hyperlipidemia: Secondary | ICD-10-CM | POA: Diagnosis not present

## 2017-08-14 DIAGNOSIS — I129 Hypertensive chronic kidney disease with stage 1 through stage 4 chronic kidney disease, or unspecified chronic kidney disease: Secondary | ICD-10-CM | POA: Diagnosis not present

## 2017-08-23 DIAGNOSIS — M109 Gout, unspecified: Secondary | ICD-10-CM | POA: Diagnosis not present

## 2017-08-23 DIAGNOSIS — Z1331 Encounter for screening for depression: Secondary | ICD-10-CM | POA: Diagnosis not present

## 2017-08-23 DIAGNOSIS — E782 Mixed hyperlipidemia: Secondary | ICD-10-CM | POA: Diagnosis not present

## 2017-08-23 DIAGNOSIS — Z139 Encounter for screening, unspecified: Secondary | ICD-10-CM | POA: Diagnosis not present

## 2017-08-23 DIAGNOSIS — I129 Hypertensive chronic kidney disease with stage 1 through stage 4 chronic kidney disease, or unspecified chronic kidney disease: Secondary | ICD-10-CM | POA: Diagnosis not present

## 2017-08-23 DIAGNOSIS — N183 Chronic kidney disease, stage 3 (moderate): Secondary | ICD-10-CM | POA: Diagnosis not present

## 2017-08-23 DIAGNOSIS — Z0000 Encounter for general adult medical examination without abnormal findings: Secondary | ICD-10-CM | POA: Diagnosis not present

## 2018-01-06 ENCOUNTER — Other Ambulatory Visit: Payer: Self-pay

## 2018-02-05 DIAGNOSIS — I129 Hypertensive chronic kidney disease with stage 1 through stage 4 chronic kidney disease, or unspecified chronic kidney disease: Secondary | ICD-10-CM | POA: Diagnosis not present

## 2018-02-05 DIAGNOSIS — M109 Gout, unspecified: Secondary | ICD-10-CM | POA: Diagnosis not present

## 2018-02-05 DIAGNOSIS — E782 Mixed hyperlipidemia: Secondary | ICD-10-CM | POA: Diagnosis not present

## 2018-02-14 DIAGNOSIS — I129 Hypertensive chronic kidney disease with stage 1 through stage 4 chronic kidney disease, or unspecified chronic kidney disease: Secondary | ICD-10-CM | POA: Diagnosis not present

## 2018-02-14 DIAGNOSIS — M109 Gout, unspecified: Secondary | ICD-10-CM | POA: Diagnosis not present

## 2018-02-14 DIAGNOSIS — Z23 Encounter for immunization: Secondary | ICD-10-CM | POA: Diagnosis not present

## 2018-02-14 DIAGNOSIS — E782 Mixed hyperlipidemia: Secondary | ICD-10-CM | POA: Diagnosis not present

## 2018-02-14 DIAGNOSIS — N183 Chronic kidney disease, stage 3 (moderate): Secondary | ICD-10-CM | POA: Diagnosis not present

## 2018-02-14 DIAGNOSIS — Z139 Encounter for screening, unspecified: Secondary | ICD-10-CM | POA: Diagnosis not present

## 2018-04-22 ENCOUNTER — Other Ambulatory Visit: Payer: Self-pay

## 2018-07-29 DIAGNOSIS — E782 Mixed hyperlipidemia: Secondary | ICD-10-CM | POA: Diagnosis not present

## 2018-07-29 DIAGNOSIS — I129 Hypertensive chronic kidney disease with stage 1 through stage 4 chronic kidney disease, or unspecified chronic kidney disease: Secondary | ICD-10-CM | POA: Diagnosis not present

## 2018-07-29 DIAGNOSIS — M109 Gout, unspecified: Secondary | ICD-10-CM | POA: Diagnosis not present

## 2018-08-07 DIAGNOSIS — N183 Chronic kidney disease, stage 3 (moderate): Secondary | ICD-10-CM | POA: Diagnosis not present

## 2018-08-07 DIAGNOSIS — M109 Gout, unspecified: Secondary | ICD-10-CM | POA: Diagnosis not present

## 2018-08-07 DIAGNOSIS — I129 Hypertensive chronic kidney disease with stage 1 through stage 4 chronic kidney disease, or unspecified chronic kidney disease: Secondary | ICD-10-CM | POA: Diagnosis not present

## 2018-08-07 DIAGNOSIS — Z139 Encounter for screening, unspecified: Secondary | ICD-10-CM | POA: Diagnosis not present

## 2018-08-07 DIAGNOSIS — E782 Mixed hyperlipidemia: Secondary | ICD-10-CM | POA: Diagnosis not present

## 2018-09-02 DIAGNOSIS — I129 Hypertensive chronic kidney disease with stage 1 through stage 4 chronic kidney disease, or unspecified chronic kidney disease: Secondary | ICD-10-CM | POA: Diagnosis not present

## 2018-09-02 DIAGNOSIS — E782 Mixed hyperlipidemia: Secondary | ICD-10-CM | POA: Diagnosis not present

## 2018-09-02 DIAGNOSIS — N183 Chronic kidney disease, stage 3 (moderate): Secondary | ICD-10-CM | POA: Diagnosis not present

## 2018-09-02 DIAGNOSIS — Z789 Other specified health status: Secondary | ICD-10-CM | POA: Diagnosis not present

## 2018-10-02 DIAGNOSIS — N183 Chronic kidney disease, stage 3 (moderate): Secondary | ICD-10-CM | POA: Diagnosis not present

## 2018-10-02 DIAGNOSIS — I129 Hypertensive chronic kidney disease with stage 1 through stage 4 chronic kidney disease, or unspecified chronic kidney disease: Secondary | ICD-10-CM | POA: Diagnosis not present

## 2018-10-02 DIAGNOSIS — H353290 Exudative age-related macular degeneration, unspecified eye, stage unspecified: Secondary | ICD-10-CM | POA: Diagnosis not present

## 2018-10-02 DIAGNOSIS — E782 Mixed hyperlipidemia: Secondary | ICD-10-CM | POA: Diagnosis not present

## 2018-10-31 DIAGNOSIS — H353290 Exudative age-related macular degeneration, unspecified eye, stage unspecified: Secondary | ICD-10-CM | POA: Diagnosis not present

## 2018-10-31 DIAGNOSIS — E782 Mixed hyperlipidemia: Secondary | ICD-10-CM | POA: Diagnosis not present

## 2018-10-31 DIAGNOSIS — N183 Chronic kidney disease, stage 3 (moderate): Secondary | ICD-10-CM | POA: Diagnosis not present

## 2018-10-31 DIAGNOSIS — I129 Hypertensive chronic kidney disease with stage 1 through stage 4 chronic kidney disease, or unspecified chronic kidney disease: Secondary | ICD-10-CM | POA: Diagnosis not present

## 2018-12-02 DIAGNOSIS — E782 Mixed hyperlipidemia: Secondary | ICD-10-CM | POA: Diagnosis not present

## 2018-12-02 DIAGNOSIS — N183 Chronic kidney disease, stage 3 (moderate): Secondary | ICD-10-CM | POA: Diagnosis not present

## 2018-12-02 DIAGNOSIS — I129 Hypertensive chronic kidney disease with stage 1 through stage 4 chronic kidney disease, or unspecified chronic kidney disease: Secondary | ICD-10-CM | POA: Diagnosis not present

## 2019-01-01 DIAGNOSIS — I129 Hypertensive chronic kidney disease with stage 1 through stage 4 chronic kidney disease, or unspecified chronic kidney disease: Secondary | ICD-10-CM | POA: Diagnosis not present

## 2019-01-01 DIAGNOSIS — E782 Mixed hyperlipidemia: Secondary | ICD-10-CM | POA: Diagnosis not present

## 2019-01-02 DIAGNOSIS — N183 Chronic kidney disease, stage 3 (moderate): Secondary | ICD-10-CM | POA: Diagnosis not present

## 2019-01-02 DIAGNOSIS — I129 Hypertensive chronic kidney disease with stage 1 through stage 4 chronic kidney disease, or unspecified chronic kidney disease: Secondary | ICD-10-CM | POA: Diagnosis not present

## 2019-01-02 DIAGNOSIS — E782 Mixed hyperlipidemia: Secondary | ICD-10-CM | POA: Diagnosis not present

## 2019-01-02 DIAGNOSIS — H353290 Exudative age-related macular degeneration, unspecified eye, stage unspecified: Secondary | ICD-10-CM | POA: Diagnosis not present

## 2019-01-08 DIAGNOSIS — Z139 Encounter for screening, unspecified: Secondary | ICD-10-CM | POA: Diagnosis not present

## 2019-01-08 DIAGNOSIS — Z0000 Encounter for general adult medical examination without abnormal findings: Secondary | ICD-10-CM | POA: Diagnosis not present

## 2019-01-08 DIAGNOSIS — Z1331 Encounter for screening for depression: Secondary | ICD-10-CM | POA: Diagnosis not present

## 2019-01-08 DIAGNOSIS — I129 Hypertensive chronic kidney disease with stage 1 through stage 4 chronic kidney disease, or unspecified chronic kidney disease: Secondary | ICD-10-CM | POA: Diagnosis not present

## 2019-01-08 DIAGNOSIS — N183 Chronic kidney disease, stage 3 (moderate): Secondary | ICD-10-CM | POA: Diagnosis not present

## 2019-01-08 DIAGNOSIS — H353290 Exudative age-related macular degeneration, unspecified eye, stage unspecified: Secondary | ICD-10-CM | POA: Diagnosis not present

## 2019-01-08 DIAGNOSIS — E782 Mixed hyperlipidemia: Secondary | ICD-10-CM | POA: Diagnosis not present

## 2019-01-12 DIAGNOSIS — H6120 Impacted cerumen, unspecified ear: Secondary | ICD-10-CM | POA: Diagnosis not present

## 2019-01-26 DIAGNOSIS — H6120 Impacted cerumen, unspecified ear: Secondary | ICD-10-CM | POA: Diagnosis not present

## 2019-02-02 DIAGNOSIS — I129 Hypertensive chronic kidney disease with stage 1 through stage 4 chronic kidney disease, or unspecified chronic kidney disease: Secondary | ICD-10-CM | POA: Diagnosis not present

## 2019-02-02 DIAGNOSIS — N183 Chronic kidney disease, stage 3 (moderate): Secondary | ICD-10-CM | POA: Diagnosis not present

## 2019-02-02 DIAGNOSIS — E782 Mixed hyperlipidemia: Secondary | ICD-10-CM | POA: Diagnosis not present

## 2019-02-17 DIAGNOSIS — Z23 Encounter for immunization: Secondary | ICD-10-CM | POA: Diagnosis not present

## 2019-03-04 DIAGNOSIS — H353290 Exudative age-related macular degeneration, unspecified eye, stage unspecified: Secondary | ICD-10-CM | POA: Diagnosis not present

## 2019-03-04 DIAGNOSIS — I129 Hypertensive chronic kidney disease with stage 1 through stage 4 chronic kidney disease, or unspecified chronic kidney disease: Secondary | ICD-10-CM | POA: Diagnosis not present

## 2019-03-04 DIAGNOSIS — E782 Mixed hyperlipidemia: Secondary | ICD-10-CM | POA: Diagnosis not present

## 2019-03-04 DIAGNOSIS — N183 Chronic kidney disease, stage 3 (moderate): Secondary | ICD-10-CM | POA: Diagnosis not present

## 2019-04-02 DIAGNOSIS — R300 Dysuria: Secondary | ICD-10-CM | POA: Diagnosis not present

## 2019-04-02 DIAGNOSIS — F0391 Unspecified dementia with behavioral disturbance: Secondary | ICD-10-CM | POA: Diagnosis not present

## 2019-04-02 DIAGNOSIS — Z6828 Body mass index (BMI) 28.0-28.9, adult: Secondary | ICD-10-CM | POA: Diagnosis not present

## 2019-04-02 DIAGNOSIS — H353290 Exudative age-related macular degeneration, unspecified eye, stage unspecified: Secondary | ICD-10-CM | POA: Diagnosis not present

## 2019-04-03 DIAGNOSIS — I129 Hypertensive chronic kidney disease with stage 1 through stage 4 chronic kidney disease, or unspecified chronic kidney disease: Secondary | ICD-10-CM | POA: Diagnosis not present

## 2019-04-03 DIAGNOSIS — E782 Mixed hyperlipidemia: Secondary | ICD-10-CM | POA: Diagnosis not present

## 2019-04-03 DIAGNOSIS — H353290 Exudative age-related macular degeneration, unspecified eye, stage unspecified: Secondary | ICD-10-CM | POA: Diagnosis not present

## 2019-05-04 DIAGNOSIS — E782 Mixed hyperlipidemia: Secondary | ICD-10-CM | POA: Diagnosis not present

## 2019-05-04 DIAGNOSIS — H353290 Exudative age-related macular degeneration, unspecified eye, stage unspecified: Secondary | ICD-10-CM | POA: Diagnosis not present

## 2019-05-04 DIAGNOSIS — Z13220 Encounter for screening for lipoid disorders: Secondary | ICD-10-CM | POA: Diagnosis not present

## 2019-05-04 DIAGNOSIS — I129 Hypertensive chronic kidney disease with stage 1 through stage 4 chronic kidney disease, or unspecified chronic kidney disease: Secondary | ICD-10-CM | POA: Diagnosis not present

## 2019-05-11 DIAGNOSIS — F0391 Unspecified dementia with behavioral disturbance: Secondary | ICD-10-CM | POA: Diagnosis not present

## 2019-05-11 DIAGNOSIS — N183 Chronic kidney disease, stage 3 unspecified: Secondary | ICD-10-CM | POA: Diagnosis not present

## 2019-05-11 DIAGNOSIS — I129 Hypertensive chronic kidney disease with stage 1 through stage 4 chronic kidney disease, or unspecified chronic kidney disease: Secondary | ICD-10-CM | POA: Diagnosis not present

## 2019-05-11 DIAGNOSIS — E782 Mixed hyperlipidemia: Secondary | ICD-10-CM | POA: Diagnosis not present

## 2019-05-23 DIAGNOSIS — I10 Essential (primary) hypertension: Secondary | ICD-10-CM | POA: Diagnosis not present

## 2019-05-23 DIAGNOSIS — M25552 Pain in left hip: Secondary | ICD-10-CM | POA: Diagnosis not present

## 2019-05-23 DIAGNOSIS — R600 Localized edema: Secondary | ICD-10-CM | POA: Diagnosis not present

## 2019-05-23 DIAGNOSIS — I499 Cardiac arrhythmia, unspecified: Secondary | ICD-10-CM | POA: Diagnosis not present

## 2019-05-23 DIAGNOSIS — M79662 Pain in left lower leg: Secondary | ICD-10-CM | POA: Diagnosis not present

## 2019-05-23 DIAGNOSIS — S79912A Unspecified injury of left hip, initial encounter: Secondary | ICD-10-CM | POA: Diagnosis not present

## 2019-05-23 DIAGNOSIS — S79911A Unspecified injury of right hip, initial encounter: Secondary | ICD-10-CM | POA: Diagnosis not present

## 2019-05-23 DIAGNOSIS — R269 Unspecified abnormalities of gait and mobility: Secondary | ICD-10-CM | POA: Diagnosis not present

## 2019-05-23 DIAGNOSIS — F0390 Unspecified dementia without behavioral disturbance: Secondary | ICD-10-CM | POA: Diagnosis not present

## 2019-05-23 DIAGNOSIS — Z882 Allergy status to sulfonamides status: Secondary | ICD-10-CM | POA: Diagnosis not present

## 2019-05-23 DIAGNOSIS — E7800 Pure hypercholesterolemia, unspecified: Secondary | ICD-10-CM | POA: Diagnosis not present

## 2019-07-02 DIAGNOSIS — N183 Chronic kidney disease, stage 3 unspecified: Secondary | ICD-10-CM | POA: Diagnosis not present

## 2019-07-02 DIAGNOSIS — F0391 Unspecified dementia with behavioral disturbance: Secondary | ICD-10-CM | POA: Diagnosis not present

## 2019-07-02 DIAGNOSIS — N390 Urinary tract infection, site not specified: Secondary | ICD-10-CM | POA: Diagnosis not present

## 2019-07-02 DIAGNOSIS — R5383 Other fatigue: Secondary | ICD-10-CM | POA: Diagnosis not present

## 2019-07-03 DIAGNOSIS — N183 Chronic kidney disease, stage 3 unspecified: Secondary | ICD-10-CM | POA: Diagnosis not present

## 2019-07-03 DIAGNOSIS — E782 Mixed hyperlipidemia: Secondary | ICD-10-CM | POA: Diagnosis not present

## 2019-07-03 DIAGNOSIS — I129 Hypertensive chronic kidney disease with stage 1 through stage 4 chronic kidney disease, or unspecified chronic kidney disease: Secondary | ICD-10-CM | POA: Diagnosis not present

## 2019-07-08 DIAGNOSIS — I129 Hypertensive chronic kidney disease with stage 1 through stage 4 chronic kidney disease, or unspecified chronic kidney disease: Secondary | ICD-10-CM | POA: Diagnosis present

## 2019-07-08 DIAGNOSIS — Z79899 Other long term (current) drug therapy: Secondary | ICD-10-CM | POA: Diagnosis not present

## 2019-07-08 DIAGNOSIS — K56609 Unspecified intestinal obstruction, unspecified as to partial versus complete obstruction: Secondary | ICD-10-CM | POA: Diagnosis not present

## 2019-07-08 DIAGNOSIS — J9601 Acute respiratory failure with hypoxia: Secondary | ICD-10-CM | POA: Diagnosis present

## 2019-07-08 DIAGNOSIS — R52 Pain, unspecified: Secondary | ICD-10-CM | POA: Diagnosis not present

## 2019-07-08 DIAGNOSIS — R410 Disorientation, unspecified: Secondary | ICD-10-CM | POA: Diagnosis not present

## 2019-07-08 DIAGNOSIS — E860 Dehydration: Secondary | ICD-10-CM | POA: Diagnosis not present

## 2019-07-08 DIAGNOSIS — M109 Gout, unspecified: Secondary | ICD-10-CM | POA: Diagnosis present

## 2019-07-08 DIAGNOSIS — U071 COVID-19: Secondary | ICD-10-CM | POA: Diagnosis present

## 2019-07-08 DIAGNOSIS — R531 Weakness: Secondary | ICD-10-CM | POA: Diagnosis not present

## 2019-07-08 DIAGNOSIS — Z66 Do not resuscitate: Secondary | ICD-10-CM | POA: Diagnosis present

## 2019-07-08 DIAGNOSIS — R0602 Shortness of breath: Secondary | ICD-10-CM | POA: Diagnosis not present

## 2019-07-08 DIAGNOSIS — N179 Acute kidney failure, unspecified: Secondary | ICD-10-CM | POA: Diagnosis not present

## 2019-07-08 DIAGNOSIS — I10 Essential (primary) hypertension: Secondary | ICD-10-CM | POA: Diagnosis not present

## 2019-07-08 DIAGNOSIS — K5790 Diverticulosis of intestine, part unspecified, without perforation or abscess without bleeding: Secondary | ICD-10-CM | POA: Diagnosis not present

## 2019-07-08 DIAGNOSIS — E785 Hyperlipidemia, unspecified: Secondary | ICD-10-CM | POA: Diagnosis present

## 2019-07-08 DIAGNOSIS — R339 Retention of urine, unspecified: Secondary | ICD-10-CM | POA: Diagnosis present

## 2019-07-08 DIAGNOSIS — Z515 Encounter for palliative care: Secondary | ICD-10-CM | POA: Diagnosis not present

## 2019-07-08 DIAGNOSIS — Z792 Long term (current) use of antibiotics: Secondary | ICD-10-CM | POA: Diagnosis not present

## 2019-07-08 DIAGNOSIS — N1831 Chronic kidney disease, stage 3a: Secondary | ICD-10-CM | POA: Diagnosis present

## 2019-07-08 DIAGNOSIS — G9341 Metabolic encephalopathy: Secondary | ICD-10-CM | POA: Diagnosis present

## 2019-07-08 DIAGNOSIS — F0390 Unspecified dementia without behavioral disturbance: Secondary | ICD-10-CM | POA: Diagnosis not present

## 2019-07-08 DIAGNOSIS — J1282 Pneumonia due to coronavirus disease 2019: Secondary | ICD-10-CM | POA: Diagnosis not present

## 2019-07-08 DIAGNOSIS — R0902 Hypoxemia: Secondary | ICD-10-CM | POA: Diagnosis not present

## 2019-07-08 DIAGNOSIS — K5669 Other partial intestinal obstruction: Secondary | ICD-10-CM | POA: Diagnosis not present

## 2019-07-08 DIAGNOSIS — K5900 Constipation, unspecified: Secondary | ICD-10-CM | POA: Diagnosis not present

## 2019-07-08 DIAGNOSIS — K219 Gastro-esophageal reflux disease without esophagitis: Secondary | ICD-10-CM | POA: Diagnosis present

## 2019-07-08 DIAGNOSIS — Z9981 Dependence on supplemental oxygen: Secondary | ICD-10-CM | POA: Diagnosis not present

## 2019-07-08 DIAGNOSIS — Z743 Need for continuous supervision: Secondary | ICD-10-CM | POA: Diagnosis not present

## 2019-07-08 DIAGNOSIS — F0391 Unspecified dementia with behavioral disturbance: Secondary | ICD-10-CM | POA: Diagnosis not present

## 2019-07-12 DIAGNOSIS — K5900 Constipation, unspecified: Secondary | ICD-10-CM | POA: Diagnosis not present

## 2019-08-03 DEATH — deceased
